# Patient Record
Sex: Female | Born: 1960 | Race: Black or African American | Hispanic: No | Marital: Single | Smoking: Former smoker
Health system: Southern US, Community
[De-identification: ages and names within clinical notes are randomized; demographics above are authoritative.]

## PROBLEM LIST (undated history)

## (undated) DIAGNOSIS — E785 Hyperlipidemia, unspecified: Secondary | ICD-10-CM

## (undated) DIAGNOSIS — I10 Essential (primary) hypertension: Secondary | ICD-10-CM

## (undated) HISTORY — PX: TONSILLECTOMY: SUR1361

## (undated) HISTORY — PX: CERVICAL SPINE SURGERY: SHX589

## (undated) HISTORY — PX: UTERINE FIBROID SURGERY: SHX826

---

## 2000-07-08 ENCOUNTER — Emergency Department (HOSPITAL_COMMUNITY): Admission: EM | Admit: 2000-07-08 | Discharge: 2000-07-08 | Payer: Self-pay | Admitting: Emergency Medicine

## 2008-05-06 ENCOUNTER — Encounter: Payer: Self-pay | Admitting: Physician Assistant

## 2008-05-06 ENCOUNTER — Ambulatory Visit: Payer: Self-pay | Admitting: Obstetrics & Gynecology

## 2008-05-06 ENCOUNTER — Other Ambulatory Visit: Admission: RE | Admit: 2008-05-06 | Discharge: 2008-05-06 | Payer: Self-pay | Admitting: Obstetrics & Gynecology

## 2008-05-06 LAB — CONVERTED CEMR LAB
Hemoglobin: 12.4 g/dL (ref 12.0–15.0)
MCHC: 34.4 g/dL (ref 30.0–36.0)
RDW: 25.6 % — ABNORMAL HIGH (ref 11.5–15.5)

## 2008-05-20 ENCOUNTER — Ambulatory Visit: Payer: Self-pay | Admitting: Obstetrics & Gynecology

## 2008-05-20 ENCOUNTER — Ambulatory Visit (HOSPITAL_COMMUNITY): Admission: RE | Admit: 2008-05-20 | Discharge: 2008-05-20 | Payer: Self-pay | Admitting: Family Medicine

## 2008-07-01 ENCOUNTER — Ambulatory Visit (HOSPITAL_COMMUNITY): Admission: RE | Admit: 2008-07-01 | Discharge: 2008-07-01 | Payer: Self-pay | Admitting: Obstetrics & Gynecology

## 2008-07-01 ENCOUNTER — Ambulatory Visit: Payer: Self-pay | Admitting: Obstetrics and Gynecology

## 2008-07-01 ENCOUNTER — Encounter: Payer: Self-pay | Admitting: Physician Assistant

## 2008-07-01 LAB — CONVERTED CEMR LAB: HCV Ab: NEGATIVE

## 2008-07-02 ENCOUNTER — Encounter: Payer: Self-pay | Admitting: Physician Assistant

## 2008-07-02 ENCOUNTER — Encounter: Payer: Self-pay | Admitting: Obstetrics & Gynecology

## 2008-07-02 LAB — CONVERTED CEMR LAB
Chlamydia, DNA Probe: NEGATIVE
GC Probe Amp, Genital: NEGATIVE
Trich, Wet Prep: NONE SEEN
Yeast Wet Prep HPF POC: NONE SEEN

## 2008-07-13 ENCOUNTER — Encounter: Admission: RE | Admit: 2008-07-13 | Discharge: 2008-07-13 | Payer: Self-pay | Admitting: Obstetrics & Gynecology

## 2008-08-05 ENCOUNTER — Ambulatory Visit: Payer: Self-pay | Admitting: Obstetrics & Gynecology

## 2008-11-04 ENCOUNTER — Ambulatory Visit: Payer: Self-pay | Admitting: Obstetrics and Gynecology

## 2009-01-20 ENCOUNTER — Ambulatory Visit: Payer: Self-pay | Admitting: Obstetrics & Gynecology

## 2009-04-07 ENCOUNTER — Ambulatory Visit: Payer: Self-pay | Admitting: Obstetrics and Gynecology

## 2009-05-13 ENCOUNTER — Ambulatory Visit: Payer: Self-pay | Admitting: Obstetrics and Gynecology

## 2009-05-13 ENCOUNTER — Ambulatory Visit (HOSPITAL_COMMUNITY): Admission: RE | Admit: 2009-05-13 | Discharge: 2009-05-13 | Payer: Self-pay | Admitting: Obstetrics and Gynecology

## 2010-02-27 ENCOUNTER — Encounter: Payer: Self-pay | Admitting: *Deleted

## 2010-04-27 LAB — CBC
MCHC: 32.9 g/dL (ref 30.0–36.0)
Platelets: 242 10*3/uL (ref 150–400)
RBC: 4.98 MIL/uL (ref 3.87–5.11)
RDW: 19 % — ABNORMAL HIGH (ref 11.5–15.5)

## 2010-06-21 NOTE — Group Therapy Note (Signed)
Suzanne Moss, HEGNER NO.:  [id number]   MEDICAL RECORD NO.:  [id number]          PATIENT TYPE:  WOC   LOCATION:  [facility]                   FACILITY:  [facility]   PHYSICIAN:  Elsie Lincoln, MD      DATE OF BIRTH:  1960-05-10   DATE OF SERVICE:  05/20/2008                                  CLINIC NOTE   The patient is a 50 year old female who presents for follow-up of her  testing for abnormal uterine bleeding that caused anemia and a blood  transfusion.  The patient had a transvaginal ultrasound which showed the  overall uterus measuring 13.4 x 6.6 x 13.2 cm.  There are at least four  discrete fibroids seen, the two largest are located in the right and  left uterine bodies measuring 9.4 cm and 5.9 cm in maximum diameters  retrospectively.  These two fibroids have partial submucosal component  displacing the endometrium.  A third fibroid is seen in the uterine  fundus which is a subserosal location and measures 5.6 cm and a fourth  fibroid is seen in the posterior uterine body measuring 5.1 cm.  The  right and left ovaries were normal.  The patient had a normal  endometrial biopsy.  The patient had a normal Pap smear, except had  Trichomonas vaginalis present.  The good news is that her hemoglobin is  12.4 which is increased from her discharge hemoglobin from a high point  of 11.1.  She started her period today and it is not heavy yet.  She  does need a mammogram.  She also needs complete STD panel testing.  I  discussed the patient's finding with her and she does not want surgery  at this time.  She would like to try the Depo-Provera shot.  I warned  her about the abnormal uterine bleeding for the first 6 months. I do not  know if she would be a good hydrothermal ablation candidate given that  there are two large submucosal fibroids invading the uterus.  I think  she would probably best be served with a hysterectomy if the Depo-  Provera does not help.   ASSESSMENT/PLAN:  A 50 year old female with abnormal uterine bleeding  and submucosal fibroids.  1. Depo-Provera.  2. Flagyl for Trichomonas.  3,  The patient refused STD testing but will get it on her follow-up  visit.  1. Return to clinic in 6 weeks.  2. Order mammogram if not already done.           ______________________________  Elsie Lincoln, MD     KL/MEDQ  D:  05/20/2008  T:  05/20/2008  Job:  [id number]

## 2010-06-21 NOTE — Group Therapy Note (Signed)
NAMEPAMI, WOOL NO.:  [id number]   MEDICAL RECORD NO.:  [id number]          PATIENT TYPE:  WOC   LOCATION:  [facility]                   FACILITY:  [facility]   PHYSICIAN:  Maylon Cos, CNM    DATE OF BIRTH:  November 16, 1960   DATE OF SERVICE:  05/06/2008                                  CLINIC NOTE   The patient presents today to GYN Clinic at [facility] as a follow-  up from [facility] ER.  The patient states that  she was evaluated the end of December.  Paperwork indicates December 27  of 2009. She went to the ER at [facility] for abdominal pain and she was  admitted for a urinary tract infection, abdominal pain, anemia and  physical finding of uterine fibroids according to the discharge  paperwork that the patient brought with her. Release of information has  been sent; however, official records are unavailable.  In the discharge  paperwork the patient's hemoglobin at the time of discharge was 11 and  all other labs were also within normal limits.  Ultrasound findings that  were noted on discharge instructions state that patient had a fibroid  uterus and a simple cyst on her left ovary measuring 1.6 cm otherwise  within normal limits.  The patient presents today really with no  complaints other than she has had a history of for the last 3 months of  having very heavy periods.  She states that the first 2-3 days of her  menstrual cycles which are regular do require her to change a super pad  every 30 minutes and then she progresses after the first 2-3 days to  normal period that lasts anywhere from 5 to 7 days.  She denies pain and  is not currently taking any medications other than iron sulfate.  She  has no known drug allergies.  Her immunizations are not up-to-date.   MENSTRUAL HISTORY:  She was 14 at menarche.  The  first day of her last  menstrual period was 04/20/2008.  Her cycles are regular.  There is  approximately 28 days  in between them.  They last 5-7 days and she has a  moderate amount of pain during her period.  Currently she is not using  anything for birth control.  She has never been pregnant.   GYNECOLOGIC HISTORY:  Her last Pap smear was in 1990.  She has no  history of any abnormal Pap smears.  Her last mammogram was in 2000 and  it was normal.   SURGICAL HISTORY:  None.   BLOOD TRANSFUSIONS:  January 2010 the patient received 2 units pack of  blood cells.   FAMILY HISTORY:  Noncontributory.   PERSONAL MEDICAL HISTORY:  Negative other than what is already been  mentioned in HPI.   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  She does not work  outside the home.  She smokes approximately 1/2 pack a day and has for  11 years.  She drank approximately one 6 pack of beer weekly.   REVIEW OF SYSTEMS:  Positive for fever or night  sweats, frequent  headaches and dizzy spells and hot flashes.   PHYSICAL EXAMINATION:  GENERAL:  On examination today Ms. Gerbino is a  frail looking African American female who appears to be older than her  stated age of 55.  VITAL SIGNS:  Her vital signs are within normal limits.  Blood pressure  is 106/75 and her weight is 107.3.   Her exam today is problem focused.  Her abdominal exam is benign.  However, her uterus is enlarged to approximately and has a fundal height  of 16 centimeters.  She has negative urine pregnancy test.  On bimanual  exam uterus is also confirmed to be enlarged to approximately 16-18  weeks size gestational size.  She has no cervical motion tenderness.  On  speculum exam, the cervix is nulliparous and smooth without lesions.  She has regular rugae.  Pap smear was obtained as well as endometrial  biopsy.  Endometrial biopsy was performed under no anesthesia.  Cervix  was cleansed using Betadine swab times one.  Tenaculum was placed at  this at 1 o'clock on the cervix and uterus sounded to 9.25 cm. One pass  was done with the  endometrial biopsy.  That retrieved a nice specimen  endometrial lining tissue.  It was placed in formalin.  Tenaculum was  removed and pressure was placed on the cervix to control a minimal  amount of bleeding.  Bleeding was ultimately stopped with silver nitrate  sticks.  The patient tolerated this procedure very well.   IMPRESSION:  1. Menorrhagia.  2. Uterine fibroids.   PLAN FOR TODAY:  1. Endometrial biopsy was performed today.  2. Pelvic ultrasound will be scheduled.  3. CBC is being drawn today.  4. Release of information from [facility] for      previous admissions records.  5. Mammogram scheduled per routine.  6. The patient will follow up approximately in 2 to 3 weeks after her      pelvic ultrasound to form plan.  The patient is not interested in      any inches any surgical procedures at this time.  In brief hormonal      options of oral contraceptives and Depo-Provera were reviewed.  The      patient she is not currently interested on starting on anything      now.  Her plan is to continue her iron supplementation and follow      up in a few weeks after the results of her biopsy and ultrasound      are available to discuss options.           ______________________________  Maylon Cos, CNM     SS/MEDQ  D:  05/06/2008  T:  05/06/2008  Job:  [id number]

## 2010-06-21 NOTE — Group Therapy Note (Signed)
Suzanne Moss, MORDAN NO.:  [id number]   MEDICAL RECORD NO.:  [id number]          PATIENT TYPE:  WOC   LOCATION:  [facility]                   FACILITY:  [facility]   PHYSICIAN:  Argentina Donovan, MD        DATE OF BIRTH:  [birth year]   DATE OF SERVICE:  07/01/2008                                  CLINIC NOTE   The patient is presenting today for follow up and STD screen.  She was  last seen in clinic on April 14 as sort of a hospital follow-up for  abnormal uterine bleeding that caused anemia and required blood  transfusion.  Her hemoglobin at that time had stabilized to 12.4 which  was up from her discharge hemoglobin of 11.1.  She reports that she is  still spotting mildly.  She was also started at that visit on Depo-  Provera.  She reports that she is still spotting today but overall is  continuing to do well at home.  She was also treated for Trichomonas  with Flagyl.  She reports that her partner has since then been treated  as well.  She had at that time declined STD panel screening, and so she  is following up for that today.   ASSESSMENT AND PLAN:  A 50 year old female with abnormal uterine  bleeding, some mucosal fibroids, and recently treated for Trichomonas.  We will continue her Depo-Provera shots.  She is due for the next one in  June.  GC, chlamydia and wet prep were obtained today.  We will also  obtain HIV, syphilis and hepatitis labs today.  She will follow up as  scheduled for her June 30 appointment to see if her spotting is  improving. Also of note, she did have normal endometrial biopsy and a  normal Pap smear on her previous office visit.  She has also received  her mammogram today.  We will follow up on the results of that.           ______________________________  Argentina Donovan, MD     PR/MEDQ  D:  07/01/2008  T:  07/01/2008  Job:  [id number]

## 2010-09-30 IMAGING — MG MM DIGITAL SCREENING BILAT
5 series · 5 of 5 positions shown · non-contrast
Comparison: none

DG SCREEN MAMMOGRAM BILATERAL
Bilateral CC and MLO view(s) were taken.
Technologist: Inger Banegas, RT RM

DIGITAL SCREENING MAMMOGRAM WITH CAD:
The breast tissue is heterogeneously dense.  Possible masses are noted in both breasts.  Spot 
compression views and possibly sonography are recommended for further evaluation.

[R CC]
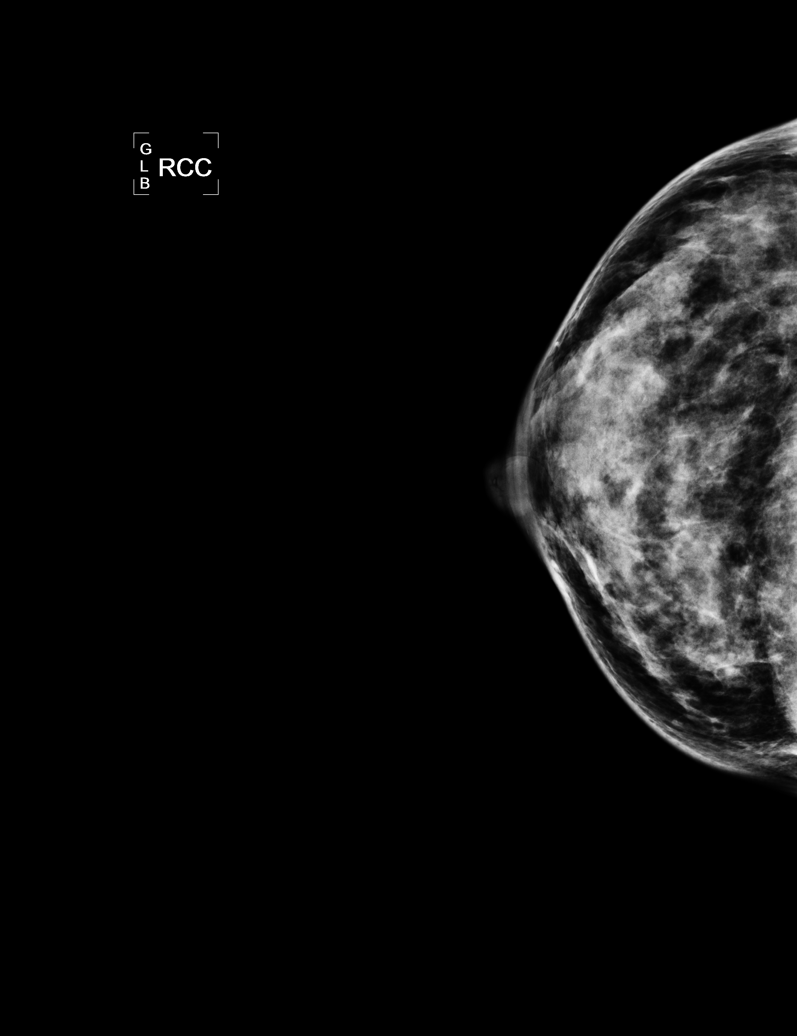

[R MLO]
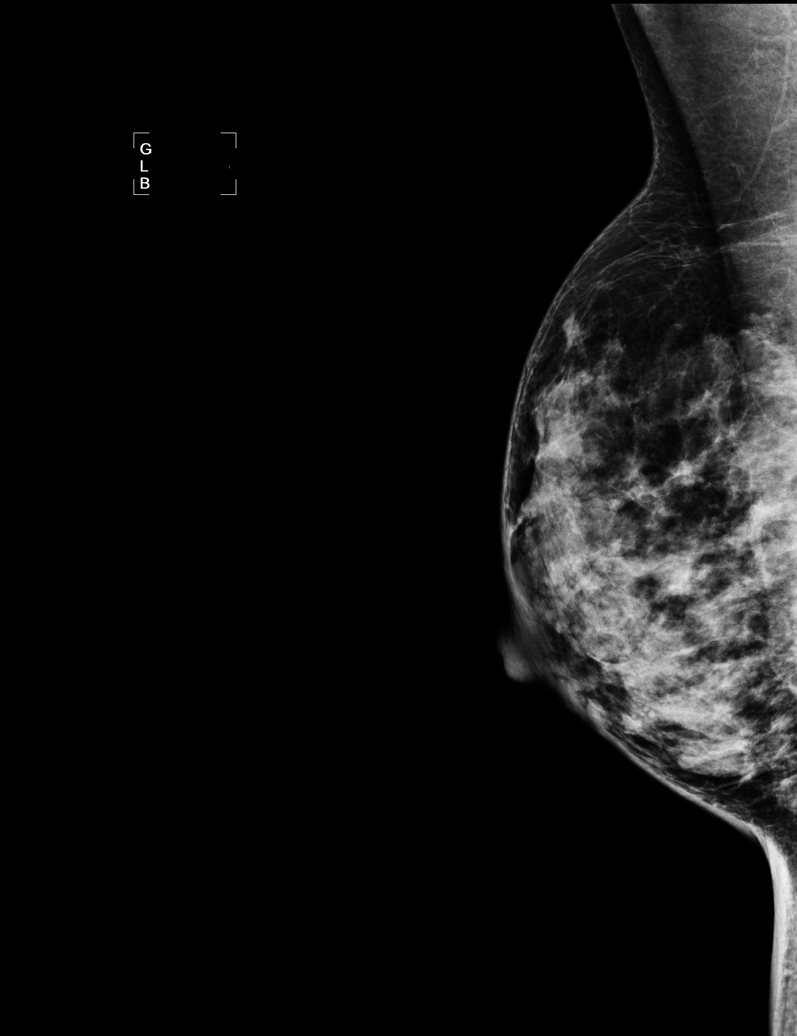

[L CC]
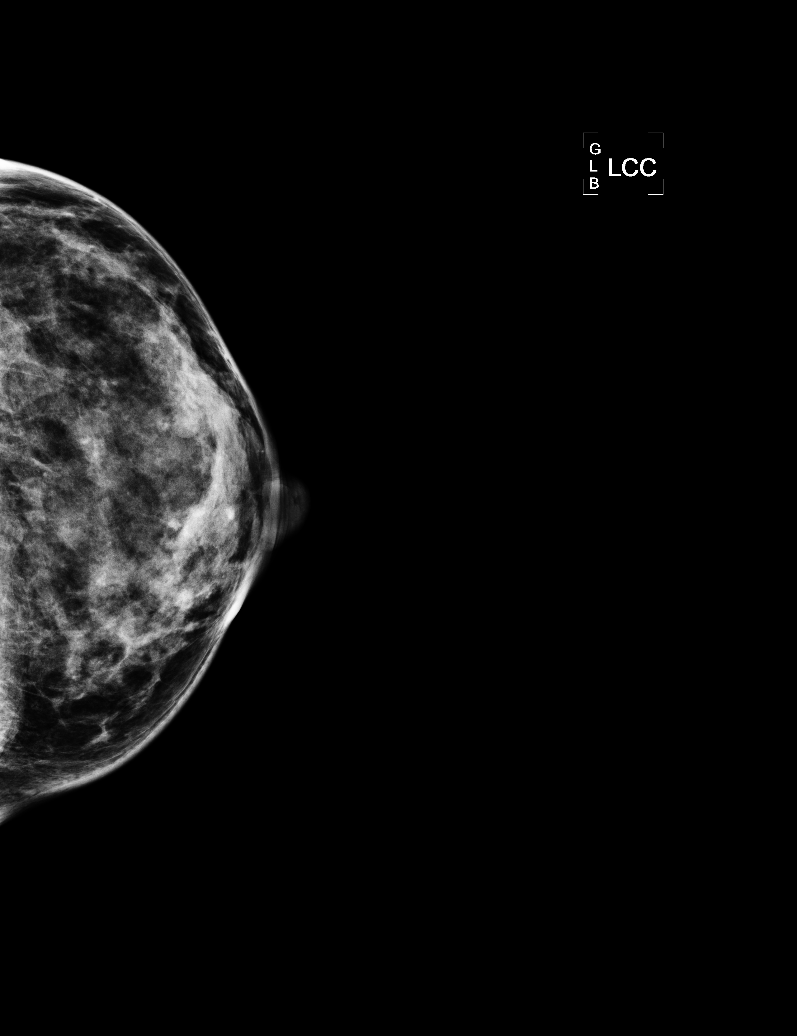

[L MLO (1 of 2)]
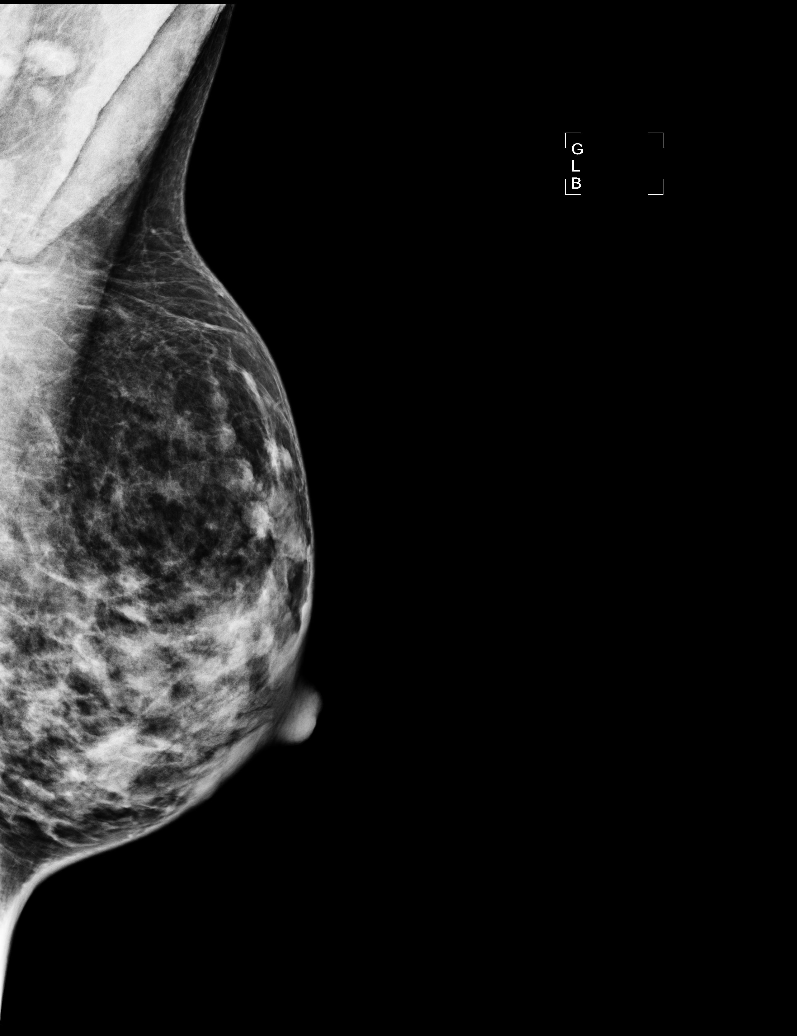

[L MLO (2 of 2)]
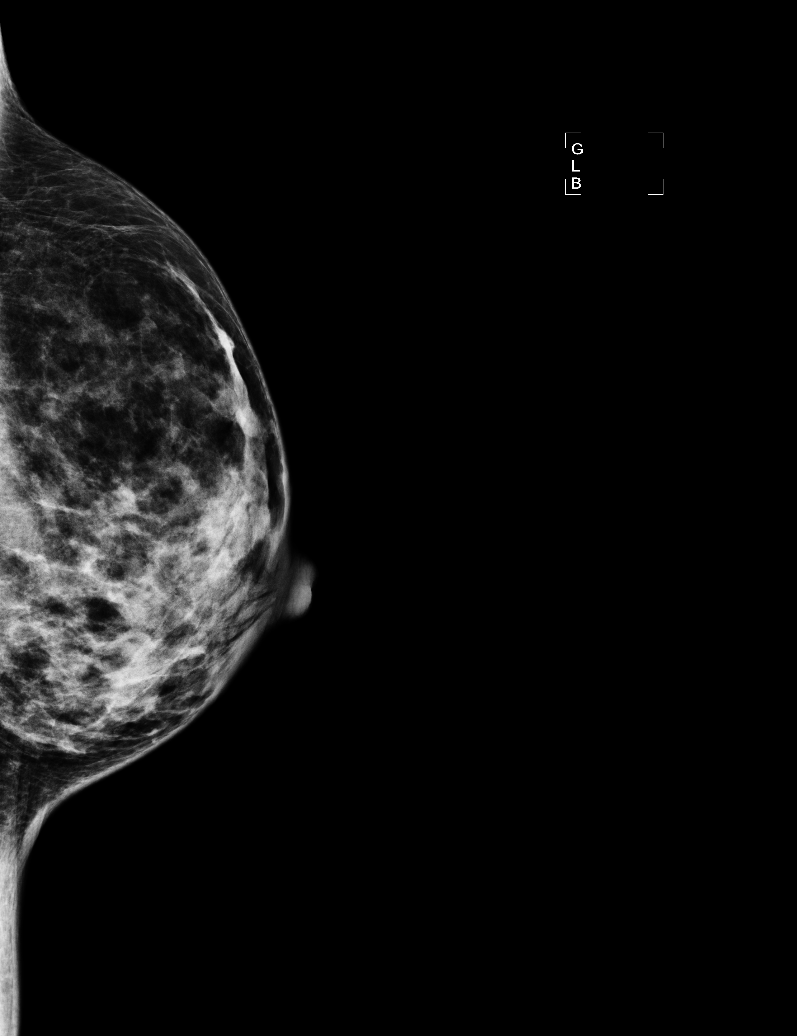

[5 of 5 positions shown; findings below may reference images not displayed]

IMPRESSION: Possible masses, bilateral breasts.  Additional evaluation is indicated.  The patient will be 
contacted for additional studies and a supplementary report will follow.

ASSESSMENT: Need additional imaging evaluation and/or prior mammograms for comparison - BI-RADS 0

Further imaging of both breasts.
ANALYZED BY COMPUTER AIDED DETECTION. , THIS PROCEDURE WAS A DIGITAL MAMMOGRAM.

## 2012-04-04 ENCOUNTER — Emergency Department (HOSPITAL_COMMUNITY)
Admission: EM | Admit: 2012-04-04 | Discharge: 2012-04-04 | Disposition: A | Discharge status: Home / Self Care | Payer: Self-pay | Attending: Emergency Medicine | Admitting: Emergency Medicine

## 2012-04-04 ENCOUNTER — Encounter (HOSPITAL_COMMUNITY): Payer: Self-pay | Admitting: Neurology

## 2012-04-04 DIAGNOSIS — F17200 Nicotine dependence, unspecified, uncomplicated: Secondary | ICD-10-CM | POA: Insufficient documentation

## 2012-04-04 DIAGNOSIS — L259 Unspecified contact dermatitis, unspecified cause: Secondary | ICD-10-CM | POA: Insufficient documentation

## 2012-04-04 DIAGNOSIS — L309 Dermatitis, unspecified: Secondary | ICD-10-CM

## 2012-04-04 DIAGNOSIS — N899 Noninflammatory disorder of vagina, unspecified: Secondary | ICD-10-CM | POA: Insufficient documentation

## 2012-04-04 MED ORDER — DIPHENHYDRAMINE HCL 25 MG PO TABS: 25.0000 mg | ORAL_TABLET | Freq: Four times a day (QID) | ORAL | Status: DC

## 2012-04-04 MED ORDER — DIPHENHYDRAMINE HCL 25 MG PO CAPS
50.0000 mg | ORAL_CAPSULE | Freq: Once | ORAL | Status: AC
  Administered 2012-04-04: 50 mg via ORAL
  Filled 2012-04-04: qty 2

## 2012-04-04 MED ORDER — PREDNISONE 10 MG PO TABS: 20.0000 mg | ORAL_TABLET | Freq: Every day | ORAL | Status: DC

## 2012-04-04 MED ORDER — PREDNISONE 20 MG PO TABS
60.0000 mg | ORAL_TABLET | Freq: Once | ORAL | Status: AC
  Administered 2012-04-04: 60 mg via ORAL
  Filled 2012-04-04: qty 3

## 2012-04-04 NOTE — ED Notes (Signed)
Pt stating vaginal itching x 1 week. Denies vaginal discharge. Denies urinary s/s.

## 2012-04-04 NOTE — ED Provider Notes (Signed)
History     CSN: [id number]  Arrival date & time 04/04/12  1653   First MD Initiated Contact with Patient 04/04/12 1751      Chief Complaint  Patient presents with  . Vaginitis    (Consider location/radiation/quality/duration/timing/severity/associated sxs/prior treatment) HPI Patient complaining of itching in the external vaginal area and groin for 2 days. She denies any intravaginal itching or discharge. She is postmenopausal for a year. She is sexually active with the same partner for 2 years denies any history of sexually transmitted diseases. She's not having any frequency of urination or dysuria. She has had no known new exposures to any soaps or change in detergent. She has been using vaginal cream without relief. History reviewed. No pertinent past medical history.  Past Surgical History  Procedure Laterality Date  . Uterine fibroid surgery      No family history on file.  History  Substance Use Topics  . Smoking status: Current Every Day Smoker  . Smokeless tobacco: Not on file  . Alcohol Use: Yes    OB History   Grav Para Term Preterm Abortions TAB SAB Ect Mult Living                  Review of Systems  All other systems reviewed and are negative.    Allergies  Review of patient's allergies indicates no known allergies.  Home Medications   Current Outpatient Rx  Name  Route  Sig  Dispense  Refill  . benzocaine-resorcinol (VAGISIL) 5-2 % vaginal cream   Vaginal   Place 1 application vaginally 2 (two) times daily as needed for itching or irritation.         Marland Kitchen ibuprofen (ADVIL,MOTRIN) 200 MG tablet   Oral   Take 400 mg by mouth every 6 (six) hours as needed for pain.           BP 130/89  Pulse 83  Temp(Src) 98.7 F (37.1 C) (Oral)  Resp 16  SpO2 100%  Physical Exam  Nursing note and vitals reviewed. Constitutional: She is oriented to person, place, and time. She appears well-developed and well-nourished.  HENT:  Head: Normocephalic  and atraumatic.  Right Ear: External ear normal.  Left Ear: External ear normal.  Eyes: Pupils are equal, round, and reactive to light.  Neck: Normal range of motion. Neck supple.  Cardiovascular: Normal rate, regular rhythm and normal heart sounds.   Pulmonary/Chest: Effort normal.  Abdominal: Soft. Bowel sounds are normal. There is no tenderness.  Genitourinary:  Patient with some excoriation of the skin on the lateral aspect of the labia majora and into the groin folds. Also some peri-rectal skin excoriation. Labia minor are normal. Speculum exam reveals normal discharge with no abnormality noted of the cervix.  Musculoskeletal: Normal range of motion.  Neurological: She is alert and oriented to person, place, and time.  Skin: Skin is warm and dry.  Psychiatric: She has a normal mood and affect.    ED Course  Procedures (including critical care time)  Labs Reviewed - No data to display No results found.   No diagnosis found.    MDM  Patient with vaginal dermatitis. She is advised to use warm soaks, she was placed on prednisone and given Benadryl.      Hilario Quarry, MD 04/04/12 [phone]

## 2019-08-07 LAB — COLOGUARD: COLOGUARD: NEGATIVE

## 2021-05-07 ENCOUNTER — Other Ambulatory Visit: Payer: Self-pay

## 2021-05-07 ENCOUNTER — Emergency Department (HOSPITAL_BASED_OUTPATIENT_CLINIC_OR_DEPARTMENT_OTHER)
Admission: EM | Admit: 2021-05-07 | Discharge: 2021-05-07 | Disposition: A | Discharge status: Home / Self Care | Payer: Medicaid Other | Attending: Emergency Medicine | Admitting: Emergency Medicine

## 2021-05-07 ENCOUNTER — Encounter (HOSPITAL_BASED_OUTPATIENT_CLINIC_OR_DEPARTMENT_OTHER): Payer: Self-pay | Admitting: Emergency Medicine

## 2021-05-07 DIAGNOSIS — X58XXXA Exposure to other specified factors, initial encounter: Secondary | ICD-10-CM | POA: Diagnosis not present

## 2021-05-07 DIAGNOSIS — S39012A Strain of muscle, fascia and tendon of lower back, initial encounter: Secondary | ICD-10-CM | POA: Diagnosis not present

## 2021-05-07 DIAGNOSIS — M545 Low back pain, unspecified: Secondary | ICD-10-CM | POA: Diagnosis present

## 2021-05-07 MED ORDER — NAPROXEN 375 MG PO TABS: 375.0000 mg | ORAL_TABLET | Freq: Two times a day (BID) | ORAL | 0 refills | Status: DC

## 2021-05-07 MED ORDER — KETOROLAC TROMETHAMINE 15 MG/ML IJ SOLN
15.0000 mg | Freq: Once | INTRAMUSCULAR | Status: AC
  Administered 2021-05-07: 15 mg via INTRAMUSCULAR
  Filled 2021-05-07: qty 1

## 2021-05-07 MED ORDER — METHOCARBAMOL 500 MG PO TABS: 500.0000 mg | ORAL_TABLET | Freq: Two times a day (BID) | ORAL | 0 refills | Status: DC

## 2021-05-07 MED ORDER — LIDOCAINE 5 % EX PTCH: 1.0000 | MEDICATED_PATCH | CUTANEOUS | 0 refills | Status: AC

## 2021-05-07 NOTE — Discharge Instructions (Addendum)
You likely have a lumbar muscle strain.  I have given you Toradol injection in the emergency room and sent Robaxin which is a muscle relaxer, naproxen which is an anti-inflammatory medication and lidocaine patch into the pharmacy for you.  He noted some improvement after the Toradol injection.  Please do not take ibuprofen along with naproxen as they are both anti-inflammatories.  Reserve Robaxin for evenings do not drive after you take it as this can make you drowsy.  If you have any new or worsening symptoms please return to the emergency room.  Otherwise follow-up with your PCP. ?

## 2021-05-07 NOTE — ED Notes (Signed)
PA at bedside.

## 2021-05-07 NOTE — ED Provider Notes (Signed)
?[facility] EMERGENCY DEPARTMENT ?Provider Note ? ? ?CSN: [id number] ?Arrival date & time: 05/07/21  1640 ? ?  ? ?History ? ?Chief Complaint  ?Patient presents with  ? Back Pain  ? ? ?Suzanne Moss is a 61 y.o. female. ? ?60 year old female presents today for evaluation of left lower back pain of 2-day duration.  Patient reports she woke up yesterday with the pain.  She denies fever, abdominal pain, nausea, vomiting, dysuria, history of kidney stones, history of IV drug use, unintentional weight loss, bowel or bladder loss, saddle anesthesia.  She denies any trauma to her back.  Denies any difficulty with ambulating. ? ?The history is provided by the patient. No language interpreter was used.  ? ?  ? ?Home Medications ?Prior to Admission medications   ?Medication Sig Start Date End Date Taking? Authorizing Provider  ?benzocaine-resorcinol (VAGISIL) 5-2 % vaginal cream Place 1 application vaginally 2 (two) times daily as needed for itching or irritation.    [provider]  ?diphenhydrAMINE (BENADRYL) 25 MG tablet Take 1 tablet (25 mg total) by mouth every 6 (six) hours. 04/04/12   Margarita Grizzle, MD  ?ibuprofen (ADVIL,MOTRIN) 200 MG tablet Take 400 mg by mouth every 6 (six) hours as needed for pain.    [provider]  ?predniSONE (DELTASONE) 10 MG tablet Take 2 tablets (20 mg total) by mouth daily. 04/04/12   Margarita Grizzle, MD  ?   ? ?Allergies    ?Gabapentin   ? ?Review of Systems   ?Review of Systems  ?Constitutional:  Negative for chills and fever.  ?Respiratory:  Negative for shortness of breath.   ?Gastrointestinal:  Negative for abdominal pain, nausea and vomiting.  ?Genitourinary:  Negative for difficulty urinating and dysuria.  ?Musculoskeletal:  Positive for back pain. Negative for gait problem.  ?Neurological:  Negative for weakness and light-headedness.  ?All other systems reviewed and are negative. ? ?Physical Exam ?Updated Vital Signs ?BP 120/83 (BP Location: Left Arm)    Pulse 84   Temp 98.4 ?F (36.9 ?C) (Oral)   Resp 16   Ht 5\' 2"  (1.575 m)   Wt 68 kg   SpO2 100%   BMI 27.44 kg/m?  ?Physical Exam ?Vitals and nursing note reviewed.  ?Constitutional:   ?   General: She is not in acute distress. ?   Appearance: Normal appearance. She is not ill-appearing.  ?HENT:  ?   Head: Normocephalic and atraumatic.  ?   Nose: Nose normal.  ?Eyes:  ?   General: No scleral icterus. ?   Extraocular Movements: Extraocular movements intact.  ?   Conjunctiva/sclera: Conjunctivae normal.  ?Cardiovascular:  ?   Rate and Rhythm: Normal rate and regular rhythm.  ?   Pulses: Normal pulses.  ?   Heart sounds: Normal heart sounds.  ?Pulmonary:  ?   Effort: Pulmonary effort is normal. No respiratory distress.  ?   Breath sounds: Normal breath sounds. No wheezing or rales.  ?Abdominal:  ?   General: There is no distension.  ?   Palpations: Abdomen is soft.  ?   Tenderness: There is no abdominal tenderness. There is no guarding.  ?Musculoskeletal:     ?   General: Normal range of motion.  ?   Cervical back: Normal range of motion.  ?   Comments: Cervical, thoracic, lumbar spine without tenderness to palpation, or step-offs.  Left lumbar paraspinal muscle with mild tenderness to palpation present.  Patient without hip, knee, ankle tenderness to palpation.  Patient with full range of motion in bilateral lower extremities with 5/5 strength.  Sensation intact.  2+ DP pulse present.  Brisk cap refill bilaterally.  Straight leg raise test negative.  Patient with full lumbar flexion, lumbar extension, lateral flexion of the lumbar without difficulty.  Thoracic rotation intact without difficulty.  Patient ambulates without difficulty.  ?Skin: ?   General: Skin is warm and dry.  ?Neurological:  ?   General: No focal deficit present.  ?   Mental Status: She is alert. Mental status is at baseline.  ? ? ?ED Results / Procedures / Treatments   ?Labs ?(all labs ordered are listed, but only abnormal results are  displayed) ?Labs Reviewed - No data to display ? ?EKG ?None ? ?Radiology ?No results found. ? ?Procedures ?Procedures  ? ? ?Medications Ordered in ED ?Medications  ?ketorolac (TORADOL) 15 MG/ML injection 15 mg (has no administration in time range)  ? ? ?ED Course/ Medical Decision Making/ A&P ?  ?                        ?Medical Decision Making ?Risk ?Prescription drug management. ? ? ?Medical Decision Making / ED Course ? ? ?This patient presents to the ED for concern of back pain, this involves an extensive number of treatment options, and is a complaint that carries with it a high risk of complications and morbidity.  The differential diagnosis includes muscle strain, lumbar spine fracture, spinal epidural abscess, cauda equina, nephrolithiasis, UTI, diverticulitis, pancreatitis ? ?MDM: ?61 year old female presents today for evaluation of 2-day duration of back pain.  She denies trauma.  She is without any difficulty with ambulating.  She is without any red flag signs or symptoms concerning for cauda equina, or spinal epidural abscess.  Patient is without dysuria concerning for UTI.  Abdomen nontender and nondistended and without associated symptoms concerning for acute intra-abdominal process.  Patient most likely has lumbar muscle strain.  Straight leg raise test negative for sciatica.  Patient appears comfortable on exam without acute distress.  Will provide Toradol IM in the emergency room.  Will discharge with naproxen, Robaxin, and lidocaine patch.  Discussed importance of follow-up with her PCP.  Patient voices understanding and is in agreement with plan.  Return precautions discussed for any concerning symptoms of a serious cause of back pain. ? ? ?Lab Tests: ?-I ordered, reviewed, and interpreted labs.   ?The pertinent results include:   ?Labs Reviewed - No data to display  ? ? ?EKG ? EKG Interpretation ? ?Date/Time:    ?Ventricular Rate:    ?PR Interval:    ?QRS Duration:   ?QT Interval:    ?QTC  Calculation:   ?R Axis:     ?Text Interpretation:   ?  ? ?  ? ? ? ?Medicines ordered and prescription drug management: ?Meds ordered this encounter  ?Medications  ? ketorolac (TORADOL) 15 MG/ML injection 15 mg  ?  ?-I have reviewed the patients home medicines and have made adjustments as needed ? ?Reevaluation: ?After the interventions noted above, I reevaluated the patient and found that they have :improved ? ?Co morbidities that complicate the patient evaluation ?History reviewed. No pertinent past medical history.  ? ? ?Dispostion: ?Patient is appropriate for discharge.  Discharged in stable condition.  Return precautions discussed.  Patient voiced understanding and is in agreement with plan. ? ? ? ?Final Clinical Impression(s) / ED Diagnoses ?Final diagnoses:  ?Strain of lumbar region, initial encounter  ? ? ?  Rx / DC Orders ?ED Discharge Orders   ? ?      Ordered  ?  naproxen (NAPROSYN) 375 MG tablet  2 times daily       ? 05/07/21 1744  ?  methocarbamol (ROBAXIN) 500 MG tablet  2 times daily       ? 05/07/21 1744  ?  lidocaine (LIDODERM) 5 %  Every 24 hours       ? 05/07/21 1744  ? ?  ?  ? ?  ? ? ?  ?Marita Kansas, PA-C ?05/07/21 1744 ? ?  ?Melene Plan, DO ?05/07/21 1903 ? ?

## 2021-05-07 NOTE — ED Triage Notes (Signed)
Pt arrives pov with c/o left lower back pain acutely yesterday. Pt denies fever or injury, denies urinary symptoms. No improvement with otc meds ?

## 2021-05-07 NOTE — ED Notes (Signed)
Pt ambulatory to restroom with steady gate.

## 2022-01-18 ENCOUNTER — Emergency Department (HOSPITAL_BASED_OUTPATIENT_CLINIC_OR_DEPARTMENT_OTHER)
Admission: EM | Admit: 2022-01-18 | Discharge: 2022-01-18 | Disposition: A | Discharge status: Home / Self Care | Payer: Medicaid Other | Attending: Emergency Medicine | Admitting: Emergency Medicine

## 2022-01-18 ENCOUNTER — Other Ambulatory Visit: Payer: Self-pay

## 2022-01-18 ENCOUNTER — Encounter (HOSPITAL_BASED_OUTPATIENT_CLINIC_OR_DEPARTMENT_OTHER): Payer: Self-pay

## 2022-01-18 DIAGNOSIS — R21 Rash and other nonspecific skin eruption: Secondary | ICD-10-CM | POA: Diagnosis present

## 2022-01-18 DIAGNOSIS — R799 Abnormal finding of blood chemistry, unspecified: Secondary | ICD-10-CM | POA: Insufficient documentation

## 2022-01-18 LAB — CBG MONITORING, ED: Glucose-Capillary: 120 mg/dL — ABNORMAL HIGH (ref 70–99)

## 2022-01-18 MED ORDER — CEPHALEXIN 500 MG PO CAPS: 500.0000 mg | ORAL_CAPSULE | Freq: Four times a day (QID) | ORAL | 0 refills | Status: DC

## 2022-01-18 NOTE — ED Provider Notes (Signed)
[facility] EMERGENCY DEPARTMENT Provider Note   CSN: [id number] Arrival date & time: 01/18/22  1531     History  Chief Complaint  Patient presents with   Wound Check    Suzanne Moss is a 61 y.o. female presenting to the ED today with chief complaint of new leg wounds on the outside of her legs.  States a large, dark purple wound began forming on the outside of her left lower leg about a month and a half ago, has been continuing to develop.  Has noticed a similar wound developing on the outside of the right lower leg starting a week and a half ago.  Denies discharge from wounds.  Noticed the wound had some itching, read online that apple cider vinegar helps this, applied some, and noticed some improvement.  No Hx of diabetes, known vascular disease, or autoimmune disease.  Denies headache, vision changes, abdominal pain, chest pain, shortness of breath, nausea, vomiting, leg pain, leg weakness, or numbness or tingling in the lower extremities.  Saw her PCP today, was informed that getting a appropriate referral would likely take too long, recommended pt to come to the ED.  No other complaints at this time.  The history is provided by the patient and medical records.  Wound Check     Home Medications Prior to Admission medications   Medication Sig Start Date End Date Taking? Authorizing Provider  benzocaine-resorcinol (VAGISIL) 5-2 % vaginal cream Place 1 application vaginally 2 (two) times daily as needed for itching or irritation.    [provider]  cephALEXin (KEFLEX) 500 MG capsule Take 1 capsule (500 mg total) by mouth 4 (four) times daily. 01/18/22   Cecil Cobbs, PA-C  diphenhydrAMINE (BENADRYL) 25 MG tablet Take 1 tablet (25 mg total) by mouth every 6 (six) hours. 04/04/12   Margarita Grizzle, MD  ibuprofen (ADVIL,MOTRIN) 200 MG tablet Take 400 mg by mouth every 6 (six) hours as needed for pain.    [provider]  lidocaine (LIDODERM) 5 %  Place 1 patch onto the skin daily. Remove & Discard patch within 12 hours or as directed by MD 05/07/21   Marita Kansas, PA-C  methocarbamol (ROBAXIN) 500 MG tablet Take 1 tablet (500 mg total) by mouth 2 (two) times daily. 05/07/21   Marita Kansas, PA-C  naproxen (NAPROSYN) 375 MG tablet Take 1 tablet (375 mg total) by mouth 2 (two) times daily. 05/07/21   Karie Mainland, Amjad, PA-C  predniSONE (DELTASONE) 10 MG tablet Take 2 tablets (20 mg total) by mouth daily. 04/04/12   Margarita Grizzle, MD     Allergies    Gabapentin    Review of Systems   Review of Systems  Skin:  Positive for wound.   Physical Exam Updated Vital Signs BP 121/82 (BP Location: Left Arm)   Pulse 92   Temp 98.5 F (36.9 C) (Oral)   Resp 18   Ht 5\' 2"  (1.575 m)   Wt 69.9 kg   SpO2 99%   BMI 28.17 kg/m  Physical Exam Vitals and nursing note reviewed.  Constitutional:      General: She is not in acute distress.    Appearance: She is well-developed.  HENT:     Head: Normocephalic and atraumatic.  Eyes:     General: No scleral icterus.    Conjunctiva/sclera: Conjunctivae normal.  Cardiovascular:     Rate and Rhythm: Normal rate and regular rhythm.     Heart sounds: No murmur heard.  Comments: DP and PT pulses 2+ bilaterally.  CRT less than 2. Pulmonary:     Effort: Pulmonary effort is normal. No respiratory distress.     Breath sounds: Normal breath sounds.     Comments: CTAB, able to communicate without difficulty, without increased respiratory effort Abdominal:     Palpations: Abdomen is soft.     Tenderness: There is no abdominal tenderness.  Musculoskeletal:        General: No swelling.     Cervical back: Neck supple. No rigidity.     Comments: Lower extremities with ROM, strength, and coordination grossly intact.  Without subjective or objective weakness appreciated.  Skin:    General: Skin is warm and dry.     Capillary Refill: Capillary refill takes less than 2 seconds.     Comments: See photos.  Areas are  nontender, without drainage, without significant surrounding erythema, swelling or warmth.  No calf pain, swelling, erythema, or unilateral swelling.  Neurological:     Mental Status: She is alert and oriented to person, place, and time.  Psychiatric:        Mood and Affect: Mood normal.         ED Results / Procedures / Treatments   Labs (all labs ordered are listed, but only abnormal results are displayed) Labs Reviewed  CBG MONITORING, ED - Abnormal; Notable for the following components:      Result Value   Glucose-Capillary 120 (*)    All other components within normal limits    EKG None  Radiology No results found.  Procedures Procedures    Medications Ordered in ED Medications - No data to display  ED Course/ Medical Decision Making/ A&P                           Medical Decision Making Risk Prescription drug management.   61 y.o. female presents to the ED for concern of Wound Check   This involves an extensive number of treatment options, and is a complaint that carries with it a high risk of complications and morbidity.  The emergent differential diagnosis prior to evaluation includes, but is not limited to: venous ulcer, arterial ulcer, cellulitis  This is not an exhaustive differential.   Past Medical History / Co-morbidities / Social History: No significant history Social Determinants of Health include: Elderly  Additional History:  None  Lab Tests: I ordered, and personally interpreted labs.  The pertinent results include:   POC CBG 120  Imaging Studies: None  ED Course: Pt well-appearing on exam.  Nontoxic nonseptic appearing in NAD.  Hemodynamically stable.  Presenting with gradually developing rash/discoloration of lateral aspects of lower extremities.  See photos.  Left lower extremity has developed over 1.5 months, the other over 1.5 weeks.  Clinical appearance not strongly suggestive of venous ulcer, arterial ulcer, or diabetic ulcer.   Again without hx of diabetes mellitus, known vascular disease, or other predisposed risk factors.  Has developed over time, and at equal locations of lateral malleoli on both sides.  Nonpainful, without weeping, without punched-out appearance, without significant surrounding erythema or warmth.  No lower extremity swelling.  No prior Hx of DVT, without clinical suggestion of DVT.  Without shortness of breath or chest pain.  Lower extremities appear neurovascularly intact without evidence of fluid overload or poor blood supply. Overall, I am uncertain the exact etiology of the patient's symptoms.  However, I do not believe she is currently experiencing  a medical, surgical, or psychiatric emergency.  May be a very early and so developing cellulitis, patient may benefit from a short course of antibiotics.  Shared decision making, patient elects to proceed with short course of antibiotics and PCP follow-up.  Also recommend following up with dermatology for further evaluation, resources provided.  Patient reports satisfaction with today's encounter.  Patient in NAD and in good condition at time of discharge.  Disposition: After consideration the patient's encounter today, I do not feel today's workup suggests an emergent condition requiring admission or immediate intervention beyond what has been performed at this time.  Safe for discharge; instructed to return immediately for worsening symptoms, change in symptoms or any other concerns.  I have reviewed the patients home medicines and have made adjustments as needed.  Discussed course of treatment with the patient, whom demonstrated understanding.  Patient in agreement and has no further questions.    I discussed this case with my attending physician Dr. Rush Landmark, who agreed with the proposed treatment course and cosigned this note.  Attending physician stated agreement with plan or made changes to plan which were implemented.     This chart was dictated using  voice recognition software.  Despite best efforts to proofread, errors can occur which can change the documentation meaning.         Final Clinical Impression(s) / ED Diagnoses Final diagnoses:  Rash of unknown cause    Rx / DC Orders ED Discharge Orders          Ordered    cephALEXin (KEFLEX) 500 MG capsule  4 times daily,   Status:  Discontinued        01/18/22 1701    cephALEXin (KEFLEX) 500 MG capsule  4 times daily        01/18/22 1702              Cecil Cobbs, [place] 01/22/22 1740    Tegeler, Canary Brim, MD 01/23/22 [phone]

## 2022-01-18 NOTE — ED Triage Notes (Signed)
Pt presents with an ulceration on the lateral aspect of her L lower leg. Area is erythremic and ecchymotic with small breaks in the skin. Lesion has been present and gradually worsening x 1 month. Pt has another smaller ulceration on the R lateral leg with similar appearance that appeared 1 week ago.

## 2022-01-18 NOTE — ED Notes (Signed)
Discharge instructions reviewed with patient. Patient verbalizes understanding, no further questions at this time. Medications/prescriptions and follow up information provided. No acute distress noted at time of departure.  

## 2022-01-18 NOTE — Discharge Instructions (Addendum)
Your evaluated in the emergency department today due to leg wounds of the lower legs.  Overall these do not appear to be venous ulcers or arterial ulcers at this time.  Please follow-up with dermatology for further evaluation and continued medical management.  Contact information for to local dermatology offices have been provided for you.  Please call to schedule an appointment.  Please continue to follow-up with your PCP within the next 5 days for reevaluation as well.  An antibiotic has been called in your pharmacy by the name of Keflex.  Take as instructed.  Always take with plenty of food and water.  Continue to monitor the areas until you are able to follow-up with your PCP/dermatology.  Return to the ED for new or worsening symptoms as discussed.

## 2022-09-08 ENCOUNTER — Emergency Department (HOSPITAL_BASED_OUTPATIENT_CLINIC_OR_DEPARTMENT_OTHER)
Admission: EM | Admit: 2022-09-08 | Discharge: 2022-09-08 | Disposition: A | Discharge status: Home / Self Care | Payer: Medicaid Other | Attending: Emergency Medicine | Admitting: Emergency Medicine

## 2022-09-08 ENCOUNTER — Encounter (HOSPITAL_BASED_OUTPATIENT_CLINIC_OR_DEPARTMENT_OTHER): Payer: Self-pay

## 2022-09-08 ENCOUNTER — Other Ambulatory Visit: Payer: Self-pay

## 2022-09-08 ENCOUNTER — Emergency Department (HOSPITAL_BASED_OUTPATIENT_CLINIC_OR_DEPARTMENT_OTHER): Payer: Medicaid Other

## 2022-09-08 DIAGNOSIS — I10 Essential (primary) hypertension: Secondary | ICD-10-CM | POA: Insufficient documentation

## 2022-09-08 DIAGNOSIS — Z87891 Personal history of nicotine dependence: Secondary | ICD-10-CM | POA: Insufficient documentation

## 2022-09-08 DIAGNOSIS — R0789 Other chest pain: Secondary | ICD-10-CM | POA: Insufficient documentation

## 2022-09-08 LAB — BASIC METABOLIC PANEL
Anion gap: 7 (ref 5–15)
BUN: 17 mg/dL (ref 8–23)
CO2: 21 mmol/L — ABNORMAL LOW (ref 22–32)
Calcium: 9 mg/dL (ref 8.9–10.3)
Chloride: 106 mmol/L (ref 98–111)
Creatinine, Ser: 0.7 mg/dL (ref 0.44–1.00)
GFR, Estimated: >60 mL/min (ref >60)
Glucose, Bld: 98 mg/dL (ref 70–99)
Potassium: 3.6 mmol/L (ref 3.5–5.1)
Sodium: 134 mmol/L — ABNORMAL LOW (ref 135–145)

## 2022-09-08 LAB — CBC
HCT: 36.6 % (ref 36.0–46.0)
Hemoglobin: 12.4 g/dL (ref 12.0–15.0)
MCH: 25.1 pg — ABNORMAL LOW (ref 26.0–34.0)
MCHC: 33.9 g/dL (ref 30.0–36.0)
MCV: 74.1 fL — ABNORMAL LOW (ref 80.0–100.0)
Platelets: 245 10*3/uL (ref 150–400)
RBC: 4.94 MIL/uL (ref 3.87–5.11)
RDW: 15.1 % (ref 11.5–15.5)
WBC: 6.3 10*3/uL (ref 4.0–10.5)
nRBC: 0 % (ref 0.0–0.2)

## 2022-09-08 LAB — TROPONIN I (HIGH SENSITIVITY): Troponin I (High Sensitivity): <2 ng/L (ref <18)

## 2022-09-08 MED ORDER — FAMOTIDINE 20 MG PO TABS: 20.0000 mg | ORAL_TABLET | Freq: Every day | ORAL | 2 refills | Status: AC

## 2022-09-08 MED ORDER — FAMOTIDINE 20 MG PO TABS
20.0000 mg | ORAL_TABLET | Freq: Once | ORAL | Status: AC
  Administered 2022-09-08: 20 mg via ORAL
  Filled 2022-09-08: qty 1

## 2022-09-08 MED ORDER — ALUM & MAG HYDROXIDE-SIMETH 200-200-20 MG/5ML PO SUSP
15.0000 mL | Freq: Once | ORAL | Status: AC
  Administered 2022-09-08: 15 mL via ORAL
  Filled 2022-09-08: qty 30

## 2022-09-08 NOTE — Discharge Instructions (Signed)
As discussed, I suspect your symptoms are likely secondary to GERD.  See information attached regarding send condition.  Your heart enzymes are normal.  Chest x-ray is without signs of pneumonia or other abnormality.  EKG appears normal.  Will prescribe this medicine we gave you on the emergency department called pepcid to take at home. You may find maalox or the liquid you took over the counter at your local pharmacy.  Please do not hesitate to return to emergency department for worrisome signs and symptoms we discussed become apparent.

## 2022-09-08 NOTE — ED Provider Notes (Signed)
[place] EMERGENCY DEPARTMENT AT [facility] Provider Note   CSN: [id number] Arrival date & time: 09/08/22  1231     History  Chief Complaint  Patient presents with   Chest Pain    Suzanne Moss is a 62 y.o. female.   Chest Pain   62 year old female presents emergency department with complaints of left-sided chest tightness.  Patient states that she began with left-sided chest tightness yesterday evening right after she ate dinner.  States that she is eating dinner and that and had just finished when left-sided chest tightness began.  States that pain still lingered into this morning which prompted her visit to the emergency department.  States the pain was not worsened with exertion.  Denies any cough, fever, shortness of breath.,  Abdominal pain, nausea, vomiting, urinary symptoms, change in bowel habits.  Denies any radiation of pain.  Past medical history significant for hyper tension, hyperlipidemia  Home Medications Prior to Admission medications   Medication Sig Start Date End Date Taking? Authorizing Provider  famotidine (PEPCID) 20 MG tablet Take 1 tablet (20 mg total) by mouth daily. 09/08/22  Yes Sherian Maroon A, PA  benzocaine-resorcinol (VAGISIL) 5-2 % vaginal cream Place 1 application vaginally 2 (two) times daily as needed for itching or irritation.    [provider]  cephALEXin (KEFLEX) 500 MG capsule Take 1 capsule (500 mg total) by mouth 4 (four) times daily. 01/18/22   Cecil Cobbs, PA-C  diphenhydrAMINE (BENADRYL) 25 MG tablet Take 1 tablet (25 mg total) by mouth every 6 (six) hours. 04/04/12   Margarita Grizzle, MD  ibuprofen (ADVIL,MOTRIN) 200 MG tablet Take 400 mg by mouth every 6 (six) hours as needed for pain.    [provider]  lidocaine (LIDODERM) 5 % Place 1 patch onto the skin daily. Remove & Discard patch within 12 hours or as directed by MD 05/07/21   Marita Kansas, PA-C  methocarbamol (ROBAXIN) 500 MG tablet Take 1  tablet (500 mg total) by mouth 2 (two) times daily. 05/07/21   Marita Kansas, PA-C  naproxen (NAPROSYN) 375 MG tablet Take 1 tablet (375 mg total) by mouth 2 (two) times daily. 05/07/21   Karie Mainland, Amjad, PA-C  predniSONE (DELTASONE) 10 MG tablet Take 2 tablets (20 mg total) by mouth daily. 04/04/12   Margarita Grizzle, MD      Allergies    Gabapentin    Review of Systems   Review of Systems  Cardiovascular:  Positive for chest pain.  All other systems reviewed and are negative.   Physical Exam Updated Vital Signs BP 107/75   Pulse 61   Temp 98.1 F (36.7 C) (Oral)   Resp 20   Ht 5\' 2"  (1.575 m)   Wt 68 kg   SpO2 100%   BMI 27.44 kg/m  Physical Exam Vitals and nursing note reviewed.  Constitutional:      General: She is not in acute distress.    Appearance: She is well-developed.  HENT:     Head: Normocephalic and atraumatic.  Eyes:     Conjunctiva/sclera: Conjunctivae normal.  Cardiovascular:     Rate and Rhythm: Normal rate and regular rhythm.     Pulses: Normal pulses.  Pulmonary:     Effort: Pulmonary effort is normal. No respiratory distress.     Breath sounds: Normal breath sounds. No wheezing, rhonchi or rales.  Chest:     Chest wall: No tenderness.  Abdominal:     Palpations: Abdomen is soft.  Tenderness: There is no abdominal tenderness.  Musculoskeletal:        General: No swelling.     Cervical back: Neck supple.     Right lower leg: No edema.     Left lower leg: No edema.  Skin:    General: Skin is warm and dry.     Capillary Refill: Capillary refill takes less than 2 seconds.  Neurological:     Mental Status: She is alert.  Psychiatric:        Mood and Affect: Mood normal.     ED Results / Procedures / Treatments   Labs (all labs ordered are listed, but only abnormal results are displayed) Labs Reviewed  BASIC METABOLIC PANEL - Abnormal; Notable for the following components:      Result Value   Sodium 134 (*)    CO2 21 (*)    All other components  within normal limits  CBC - Abnormal; Notable for the following components:   MCV 74.1 (*)    MCH 25.1 (*)    All other components within normal limits  TROPONIN I (HIGH SENSITIVITY)    EKG None  Radiology DG Chest 2 View  Result Date: 09/08/2022 CLINICAL DATA:  chest tightness EXAM: CHEST - 2 VIEW COMPARISON:  None Available. FINDINGS: Bilateral lung fields are clear. Bilateral costophrenic angles are clear. Normal cardio-mediastinal silhouette. Age indeterminate mild anterior wedging deformity of lower thoracic vertebrae likely T11. Correlate clinically to determine the need for additional imaging. Otherwise, no acute osseous abnormalities. Lower cervical spinal fixation hardware is seen. The soft tissues are within normal limits. IMPRESSION: 1. No active cardiopulmonary disease. 2. Age indeterminate mild anterior wedging deformity of lower thoracic vertebrae likely T11. Electronically Signed   By: Jules Schick M.D.   On: 09/08/2022 13:35    Procedures Procedures    Medications Ordered in ED Medications  famotidine (PEPCID) tablet 20 mg (20 mg Oral Given 09/08/22 1342)  alum & mag hydroxide-simeth (MAALOX/MYLANTA) 200-200-20 MG/5ML suspension 15 mL (15 mLs Oral Given 09/08/22 1343)    ED Course/ Medical Decision Making/ A&P                                 Medical Decision Making Amount and/or Complexity of Data Reviewed Labs: ordered. Radiology: ordered.  Risk OTC drugs.   This patient presents to the ED for concern of chest pain, this involves an extensive number of treatment options, and is a complaint that carries with it a high risk of complications and morbidity.  The differential diagnosis includes ACS, PE, musculoskeletal, GERD, aortic dissection, aortic aneurysm, pericarditis/myocarditis/tamponade   Co morbidities that complicate the patient evaluation  See HPI   Additional history obtained:  Additional history obtained from EMR External records from outside  source obtained and reviewed including hospital records   Lab Tests:  I Ordered, and personally interpreted labs.  The pertinent results include: No leukocytosis.  No evidence of anemia.  Platelets within range.  Mild hyponatremia 134 as well as decrease in bicarb of 21 but otherwise, electrolytes within normal limits.  No renal dysfunction.  Troponin of less than 2    Imaging Studies ordered:  I ordered imaging studies including chest x-ray I independently visualized and interpreted imaging which showed no acute pulmonary abnormality.  Wedge deformity of T11 chronic I agree with the radiologist interpretation   Cardiac Monitoring: / EKG:  The patient was maintained on a cardiac  monitor.  I personally viewed and interpreted the cardiac monitored which showed an underlying rhythm of: Manage rhythm   Consultations Obtained:  N/a   Problem List / ED Course / Critical interventions / Medication management  Other chest pain I ordered medication including Maalox, Pepcid   Reevaluation of the patient after these medicines showed that the patient improved I have reviewed the patients home medicines and have made adjustments as needed   Social Determinants of Health:  Former cigarette use.  Denies illicit drug use.   Test / Admission - Considered:  Other chest pain Vitals signs within normal range and stable throughout visit. Laboratory/imaging studies significant for: See above 62 year old female presents emergency department complaints of left-sided chest tightness after eating dinner last night.  Symptoms have been constant since consumption of dinner.  Chest pain without radiation, nonexertional, nonpleuritic in nature.  Patient's workup today overall reassuring.  Patient with negative troponin, lack of acute ischemic changes on EKG; doubt ACS.  Given duration since symptom onset, second troponin deemed unnecessary.  Patient with heart score of 0-3.  Patient's pain nonpleuritic  in nature without associated shortness of breath, tachypnea, tachycardia, risk factors for DVT/PE.  Patient with low Pesi score so doubt PE.  Patient without pain radiating to the back, pulse deficits, neurologic deficits, significant hypertension so low suspicion for aortic dissection.  Doubt pericarditis/myocarditis/tamponade.  No evidence of pneumonia.  Patient with evidence of volume overload, pulmonary vascular congestion; doubt CHF.  Patient had resolution of symptoms with administration of GI cocktail.  I suspect patient's symptoms are likely secondary to GERD.  Will recommend treatment of GERD in the outpatient setting as well as appropriate dietary/lifestyle modifications to decrease likelihood of exacerbation.  Will recommend follow-up with primary care in the outpatient setting for reevaluation of symptoms.  Treatment plan discussed at length with patient and she acknowledged understanding was agreeable to said plan.  Patient overall well-appearing, afebrile in no acute distress. Worrisome signs and symptoms were discussed with the patient, and the patient acknowledged understanding to return to the ED if noticed. Patient was stable upon discharge.          Final Clinical Impression(s) / ED Diagnoses Final diagnoses:  Other chest pain    Rx / DC Orders ED Discharge Orders          Ordered    famotidine (PEPCID) 20 MG tablet  Daily        09/08/22 1455              Peter Garter, [place] 09/08/22 1545    Rozelle Logan, DO 09/17/22 1900

## 2022-09-08 NOTE — ED Triage Notes (Signed)
Patient having chest tightness since last night. She denied Pain, shortness of breath, cough.

## 2022-09-08 NOTE — ED Notes (Signed)
Pt. Is alert and oriented and reports she had chest tightness last night.  Pt. Is in no distress with no nausea.  Pt. Said she is feeling better today and just felt she should be checked out.

## 2022-11-13 ENCOUNTER — Other Ambulatory Visit: Payer: Self-pay

## 2022-11-13 ENCOUNTER — Emergency Department (HOSPITAL_BASED_OUTPATIENT_CLINIC_OR_DEPARTMENT_OTHER)
Admission: EM | Admit: 2022-11-13 | Discharge: 2022-11-13 | Disposition: A | Discharge status: Home / Self Care | Payer: Medicaid Other | Attending: Emergency Medicine | Admitting: Emergency Medicine

## 2022-11-13 ENCOUNTER — Encounter (HOSPITAL_BASED_OUTPATIENT_CLINIC_OR_DEPARTMENT_OTHER): Payer: Self-pay

## 2022-11-13 ENCOUNTER — Emergency Department (HOSPITAL_BASED_OUTPATIENT_CLINIC_OR_DEPARTMENT_OTHER): Payer: Medicaid Other

## 2022-11-13 DIAGNOSIS — M25511 Pain in right shoulder: Secondary | ICD-10-CM | POA: Diagnosis present

## 2022-11-13 DIAGNOSIS — M62838 Other muscle spasm: Secondary | ICD-10-CM | POA: Insufficient documentation

## 2022-11-13 HISTORY — DX: Hyperlipidemia, unspecified: E78.5

## 2022-11-13 HISTORY — DX: Essential (primary) hypertension: I10

## 2022-11-13 MED ORDER — CYCLOBENZAPRINE HCL 10 MG PO TABS: 10.0000 mg | ORAL_TABLET | Freq: Two times a day (BID) | ORAL | 0 refills | Status: DC | PRN

## 2022-11-13 MED ORDER — LIDOCAINE 5 % EX PTCH
1.0000 | MEDICATED_PATCH | CUTANEOUS | Status: DC
  Administered 2022-11-13: 1 via TRANSDERMAL
  Filled 2022-11-13: qty 1

## 2022-11-13 MED ORDER — LIDOCAINE 5 % EX PTCH: 1.0000 | MEDICATED_PATCH | CUTANEOUS | 0 refills | Status: AC

## 2022-11-13 NOTE — Discharge Instructions (Addendum)
Please follow-up with your primary care provider in regards to recent ER visit.  Today your exam shows you have a muscle spasm causing your symptoms.  You may take Tylenol every 6 hours needed for pain.  You may use heat packs along with the Flexeril I prescribed for you.  Please do not operate machinery or drive after taking this medication as it is very sedating.  You may also get massages to help with the affected area.  If symptoms change or worsen please return to ER.

## 2022-11-13 NOTE — ED Notes (Signed)
Patient transported to X-ray 

## 2022-11-13 NOTE — ED Triage Notes (Signed)
In for eval of right lower neck and right shoulder pain onset 3 days ago. Denies fall or injury.

## 2022-11-13 NOTE — ED Provider Notes (Signed)
[place] [facility] Provider Note   CSN: [id number] Arrival date & time: 11/13/22  0944     History  Chief Complaint  Patient presents with   Shoulder Pain    Suzanne Moss is a 62 y.o. female history of chronic right shoulder pain currently being seen by orthopedics and [place] presented with right shoulder pain that began last night.  Patient states it feels like there is a tightness in the muscle above her shoulder between her shoulder and neck denies neck pain.  Patient denies new onset weakness, change in sensation of smell skills, trauma, deformities.  Patient has tried ibuprofen along with heat and ice packs which do seem to help and wants to have it evaluated.  Home Medications Prior to Admission medications   Medication Sig Start Date End Date Taking? Authorizing Provider  amLODipine (NORVASC) 5 MG tablet Take 5 mg by mouth daily.   Yes [provider]  atorvastatin (LIPITOR) 10 MG tablet Take 10 mg by mouth daily.   Yes [provider]  cyclobenzaprine (FLEXERIL) 10 MG tablet Take 1 tablet (10 mg total) by mouth 2 (two) times daily as needed for muscle spasms. 11/13/22  Yes Palin Tristan, Fayrene Fearing T, PA-C  lidocaine (LIDODERM) 5 % Place 1 patch onto the skin daily. Remove & Discard patch within 12 hours or as directed by MD 11/13/22  Yes Lorelle Macaluso, Beverly Gust, PA-C  benzocaine-resorcinol (VAGISIL) 5-2 % vaginal cream Place 1 application vaginally 2 (two) times daily as needed for itching or irritation.    [provider]  cephALEXin (KEFLEX) 500 MG capsule Take 1 capsule (500 mg total) by mouth 4 (four) times daily. 01/18/22   Cecil Cobbs, PA-C  diphenhydrAMINE (BENADRYL) 25 MG tablet Take 1 tablet (25 mg total) by mouth every 6 (six) hours. 04/04/12   Margarita Grizzle, MD  famotidine (PEPCID) 20 MG tablet Take 1 tablet (20 mg total) by mouth daily. 09/08/22   Peter Garter, PA  ibuprofen (ADVIL,MOTRIN) 200 MG  tablet Take 400 mg by mouth every 6 (six) hours as needed for pain.    [provider]  lidocaine (LIDODERM) 5 % Place 1 patch onto the skin daily. Remove & Discard patch within 12 hours or as directed by MD 05/07/21   Marita Kansas, PA-C  methocarbamol (ROBAXIN) 500 MG tablet Take 1 tablet (500 mg total) by mouth 2 (two) times daily. 05/07/21   Marita Kansas, PA-C  naproxen (NAPROSYN) 375 MG tablet Take 1 tablet (375 mg total) by mouth 2 (two) times daily. 05/07/21   Karie Mainland, Amjad, PA-C  predniSONE (DELTASONE) 10 MG tablet Take 2 tablets (20 mg total) by mouth daily. 04/04/12   Margarita Grizzle, MD      Allergies    Gabapentin    Review of Systems   Review of Systems  Physical Exam Updated Vital Signs BP 116/78   Pulse 68   Temp 97.8 F (36.6 C) (Oral)   Resp 17   Ht 5\' 2"  (1.575 m)   Wt 68 kg   SpO2 100%   BMI 27.44 kg/m  Physical Exam Vitals reviewed.  Constitutional:      General: She is not in acute distress. Cardiovascular:     Rate and Rhythm: Normal rate.     Pulses: Normal pulses.  Musculoskeletal:     Cervical back: Normal range of motion. No tenderness.     Comments: Right shoulder: 5/ 5 shoulder adduction/abduction, external rotation/internal rotation, unremarkable hand  to belly exam, unremarkable empty can test, tender to palpation right trapezius/supraspinatus muscle without abnormalities palpated, no clavicular pain or abnormalities palpated, nontender on deltoid muscle Pain not out of proportion Soft compartments  Skin:    General: Skin is warm and dry.     Capillary Refill: Capillary refill takes less than 2 seconds.  Neurological:     Mental Status: She is alert.     Comments: Sensation intact distally  Psychiatric:        Mood and Affect: Mood normal.     ED Results / Procedures / Treatments   Labs (all labs ordered are listed, but only abnormal results are displayed) Labs Reviewed - No data to display  EKG None  Radiology No results  found.  Procedures Procedures    Medications Ordered in ED Medications  lidocaine (LIDODERM) 5 % 1 patch (1 patch Transdermal Patch Applied 11/13/22 1119)    ED Course/ Medical Decision Making/ A&P                                 Medical Decision Making Amount and/or Complexity of Data Reviewed Radiology: ordered.  Risk Prescription drug management.   Suzanne Moss 62 y.o. presented today for right shoulder pain. Working DDx that I considered at this time includes, but not limited to, muscle spasm, contusion, strain/sprain, fracture, dislocation, neurovascular compromise, septic joint, ischemic limb, compartment syndrome.  R/o DDx: contusion, strain/sprain, fracture, dislocation, neurovascular compromise, septic joint, ischemic limb, compartment syndrome: These are considered less likely due to history of present illness, physical exam, labs/imaging findings.  Review of prior external notes: 03/28/2022 unknown  Unique Tests and My Interpretation:  Right shoulder x-ray: No acute changes  Discussion with Independent Historian: None  Discussion of Management of Tests: None  Risk: Medium: prescription drug management  Risk Stratification Score: None  Plan: On exam patient was no acute distress with stable vitals.  Patient's physical dam is remarkable for tenderness in the right trapezius/supraspinatus region however the rest of her exam was reassuring.  Patient describes the pain as a tightening sensation that comes and goes which is suspicious of muscle spasm.  X-ray interpreted by me does not show any acute changes and will treat with heat packs along with lidocaine patches and encouraged patient to continue taking Tylenol every 6 hours and follow-up with her primary care provider.  While waiting for x-ray to be read patient stated that she wanted to leave and so we will monitor patient's x-ray and call if there is any abnormalities.  Patient will be prescribed lidocaine  patches along with Flexeril encouraged use heat packs and rest over the next 2 days and follow-up with primary care provider.  I spoke to the patient about how Flexeril is a sedating medication to not operate machinery or drive while taking his medication patient verbalized her understanding of this.  Patient was given return precautions. Patient stable for discharge at this time.  Patient verbalized understanding of plan.  This chart was dictated using voice recognition software.  Despite best efforts to proofread,  errors can occur which can change the documentation meaning.         Final Clinical Impression(s) / ED Diagnoses Final diagnoses:  Muscle spasm    Rx / DC Orders ED Discharge Orders          Ordered    cyclobenzaprine (FLEXERIL) 10 MG tablet  2 times daily PRN  11/13/22 1113    lidocaine (LIDODERM) 5 %  Every 24 hours        11/13/22 1113              Netta Corrigan, PA-C 11/13/22 1157    Virgina Norfolk, DO 11/13/22 1235

## 2023-03-07 ENCOUNTER — Emergency Department (HOSPITAL_BASED_OUTPATIENT_CLINIC_OR_DEPARTMENT_OTHER)
Admission: EM | Admit: 2023-03-07 | Discharge: 2023-03-07 | Disposition: A | Discharge status: Home / Self Care | Payer: Medicaid Other | Attending: Emergency Medicine | Admitting: Emergency Medicine

## 2023-03-07 ENCOUNTER — Other Ambulatory Visit: Payer: Self-pay

## 2023-03-07 ENCOUNTER — Encounter (HOSPITAL_BASED_OUTPATIENT_CLINIC_OR_DEPARTMENT_OTHER): Payer: Self-pay | Admitting: Emergency Medicine

## 2023-03-07 DIAGNOSIS — H109 Unspecified conjunctivitis: Secondary | ICD-10-CM | POA: Insufficient documentation

## 2023-03-07 DIAGNOSIS — H5789 Other specified disorders of eye and adnexa: Secondary | ICD-10-CM | POA: Diagnosis present

## 2023-03-07 MED ORDER — TOBRAMYCIN 0.3 % OP SOLN
2.0000 [drp] | Freq: Once | OPHTHALMIC | Status: AC
  Administered 2023-03-07: 2 [drp] via OPHTHALMIC
  Filled 2023-03-07: qty 5

## 2023-03-07 NOTE — ED Triage Notes (Signed)
Pt having bilateral eye redness with itching and swelling since Sunday.  Noted some redness near tear ducts.

## 2023-03-07 NOTE — Discharge Instructions (Signed)
It was our pleasure to provide your ER care today - we hope that you feel better.  Use tobrex eyes drops - 1-2 drops in each eye 4x/day for next 5 days. Avoid rubbing eyes or scratching around eyes. May gently apply cool moist compresses to area a few times per day for symptom relief.  Avoid any perfumed facial soaps, moisturizers or other face products.   Follow up with eye specialist in the next few days if symptoms fail to improve/resolve - call office to arrange appointment.   Return to ER if worse, new symptoms, high fevers, new/severe eye pain, increased swelling or redness around eyes, change in vision, or other concern.

## 2023-03-07 NOTE — ED Provider Notes (Signed)
[facility] Provider Note   CSN: [id number] Arrival date & time: 03/07/23  1117     History  Chief Complaint  Patient presents with   Eye Problem    Suzanne Moss is a 63 y.o. female.  Pt c/o bilateral eye redness and scant drainage in the past three days. Denies hx similar symptoms in prior past. No severe eye pain. No change in vision. No trauma to eyes. No chemical exposure. No new home or facial products. No eye drops. Denies cough, sore throat, rhinorrhea or uri symptoms. No headache. No photophobia. No contact use. No fever or chills. Indicates otherwise does not feel sick or ill.   The history is provided by the patient and medical records.  Eye Problem Associated symptoms: discharge, itching and redness   Associated symptoms: no headaches, no nausea and no vomiting        Home Medications Prior to Admission medications   Medication Sig Start Date End Date Taking? Authorizing Provider  amLODipine (NORVASC) 5 MG tablet Take 5 mg by mouth daily.    [provider]  atorvastatin (LIPITOR) 10 MG tablet Take 10 mg by mouth daily.    [provider]  benzocaine-resorcinol (VAGISIL) 5-2 % vaginal cream Place 1 application vaginally 2 (two) times daily as needed for itching or irritation.    [provider]  cephALEXin (KEFLEX) 500 MG capsule Take 1 capsule (500 mg total) by mouth 4 (four) times daily. 01/18/22   Cecil Cobbs, PA-C  cyclobenzaprine (FLEXERIL) 10 MG tablet Take 1 tablet (10 mg total) by mouth 2 (two) times daily as needed for muscle spasms. 11/13/22   Netta Corrigan, PA-C  diphenhydrAMINE (BENADRYL) 25 MG tablet Take 1 tablet (25 mg total) by mouth every 6 (six) hours. 04/04/12   Margarita Grizzle, MD  famotidine (PEPCID) 20 MG tablet Take 1 tablet (20 mg total) by mouth daily. 09/08/22   Peter Garter, PA  ibuprofen (ADVIL,MOTRIN) 200 MG tablet Take 400 mg by mouth every 6 (six)  hours as needed for pain.    [provider]  lidocaine (LIDODERM) 5 % Place 1 patch onto the skin daily. Remove & Discard patch within 12 hours or as directed by MD 05/07/21   Marita Kansas, PA-C  lidocaine (LIDODERM) 5 % Place 1 patch onto the skin daily. Remove & Discard patch within 12 hours or as directed by MD 11/13/22   Netta Corrigan, PA-C  methocarbamol (ROBAXIN) 500 MG tablet Take 1 tablet (500 mg total) by mouth 2 (two) times daily. 05/07/21   Marita Kansas, PA-C  naproxen (NAPROSYN) 375 MG tablet Take 1 tablet (375 mg total) by mouth 2 (two) times daily. 05/07/21   Karie Mainland, Amjad, PA-C  predniSONE (DELTASONE) 10 MG tablet Take 2 tablets (20 mg total) by mouth daily. 04/04/12   Margarita Grizzle, MD      Allergies    Gabapentin    Review of Systems   Review of Systems  Constitutional:  Negative for chills and fever.  HENT:  Negative for congestion, rhinorrhea and sore throat.   Eyes:  Positive for discharge, redness and itching. Negative for visual disturbance.  Respiratory:  Negative for cough.   Gastrointestinal:  Negative for nausea and vomiting.  Genitourinary:  Negative for dysuria.  Musculoskeletal:  Negative for neck pain.  Skin:  Negative for rash.  Neurological:  Negative for headaches.    Physical Exam Updated Vital Signs BP 120/74 (BP  Location: Right Arm)   Pulse 82   Temp 98.8 F (37.1 C) (Oral)   Resp 16   Ht 1.575 m (5\' 2" )   Wt 68 kg   SpO2 100%   BMI 27.44 kg/m  Physical Exam Vitals and nursing note reviewed.  Constitutional:      Appearance: Normal appearance. She is well-developed.  HENT:     Head: Atraumatic.     Nose: Nose normal. No congestion or rhinorrhea.     Mouth/Throat:     Mouth: Mucous membranes are moist.  Eyes:     General: No scleral icterus.    Extraocular Movements: Extraocular movements intact.     Pupils: Pupils are equal, round, and reactive to light.     Comments: Conjunctiva w mild erythema, conjunctivitis.  No proptosis. No pain  w eom. Lids everted, no fb. No abscess. No orbital or periorbital cellulitis noted. Sharp discs, no PE. Pupils briskly reactive, not painful, no corneal clouding.   Neck:     Trachea: No tracheal deviation.  Cardiovascular:     Rate and Rhythm: Normal rate.  Pulmonary:     Effort: Pulmonary effort is normal. No respiratory distress.  Musculoskeletal:        General: No swelling.     Cervical back: Normal range of motion and neck supple. No rigidity. No muscular tenderness.  Lymphadenopathy:     Cervical: No cervical adenopathy.  Skin:    General: Skin is warm and dry.     Findings: No rash.  Neurological:     Mental Status: She is alert.     Comments: Alert, speech normal. Visual acuity grossly intact (reading/seeing photos/poster on wall).  Psychiatric:        Mood and Affect: Mood normal.     ED Results / Procedures / Treatments   Labs (all labs ordered are listed, but only abnormal results are displayed) Labs Reviewed - No data to display  EKG None  Radiology No results found.  Procedures Procedures    Medications Ordered in ED Medications  tobramycin (TOBREX) 0.3 % ophthalmic solution 2 drop (has no administration in time range)    ED Course/ Medical Decision Making/ A&P                                 Medical Decision Making Problems Addressed: Conjunctivitis of both eyes, unspecified conjunctivitis type: acute illness or injury  Risk Prescription drug management.  Reviewed nursing notes and prior charts for additional history.   Exam c/w conjuncitivitis.   Tobrex eye drops.   Rec ophthy f/u in next few days if symptoms fail to improve/resolve.  Return precautions provided.           Final Clinical Impression(s) / ED Diagnoses Final diagnoses:  None    Rx / DC Orders ED Discharge Orders     None         Cathren Laine, MD 03/07/23 1322

## 2023-11-16 ENCOUNTER — Emergency Department (HOSPITAL_BASED_OUTPATIENT_CLINIC_OR_DEPARTMENT_OTHER)

## 2023-11-16 ENCOUNTER — Encounter (HOSPITAL_BASED_OUTPATIENT_CLINIC_OR_DEPARTMENT_OTHER): Payer: Self-pay | Admitting: Emergency Medicine

## 2023-11-16 ENCOUNTER — Other Ambulatory Visit: Payer: Self-pay

## 2023-11-16 ENCOUNTER — Emergency Department (HOSPITAL_BASED_OUTPATIENT_CLINIC_OR_DEPARTMENT_OTHER): Admission: EM | Admit: 2023-11-16 | Discharge: 2023-11-16 | Disposition: A | Discharge status: Home / Self Care

## 2023-11-16 DIAGNOSIS — I10 Essential (primary) hypertension: Secondary | ICD-10-CM | POA: Diagnosis not present

## 2023-11-16 DIAGNOSIS — R0789 Other chest pain: Secondary | ICD-10-CM | POA: Insufficient documentation

## 2023-11-16 DIAGNOSIS — Z79899 Other long term (current) drug therapy: Secondary | ICD-10-CM | POA: Insufficient documentation

## 2023-11-16 LAB — BASIC METABOLIC PANEL WITH GFR
Anion gap: 14 (ref 5–15)
BUN: 13 mg/dL (ref 8–23)
CO2: 20 mmol/L — ABNORMAL LOW (ref 22–32)
Calcium: 9.4 mg/dL (ref 8.9–10.3)
Chloride: 103 mmol/L (ref 98–111)
Creatinine, Ser: 0.67 mg/dL (ref 0.44–1.00)
GFR, Estimated: >60 mL/min (ref >60)
Glucose, Bld: 91 mg/dL (ref 70–99)
Potassium: 4.1 mmol/L (ref 3.5–5.1)
Sodium: 137 mmol/L (ref 135–145)

## 2023-11-16 LAB — CBC
HCT: 41.6 % (ref 36.0–46.0)
Hemoglobin: 13.7 g/dL (ref 12.0–15.0)
MCH: 24.7 pg — ABNORMAL LOW (ref 26.0–34.0)
MCHC: 32.9 g/dL (ref 30.0–36.0)
MCV: 75.1 fL — ABNORMAL LOW (ref 80.0–100.0)
Platelets: 293 K/uL (ref 150–400)
RBC: 5.54 MIL/uL — ABNORMAL HIGH (ref 3.87–5.11)
RDW: 15.2 % (ref 11.5–15.5)
WBC: 5.6 K/uL (ref 4.0–10.5)
nRBC: 0 % (ref 0.0–0.2)

## 2023-11-16 LAB — TROPONIN T, HIGH SENSITIVITY: Troponin T High Sensitivity: <15 ng/L (ref 0–19)

## 2023-11-16 MED ORDER — NAPROXEN 500 MG PO TABS: 500.0000 mg | ORAL_TABLET | Freq: Two times a day (BID) | ORAL | 0 refills | Status: AC

## 2023-11-16 NOTE — ED Provider Notes (Signed)
 [place] [facility] Provider Note   CSN: [id number] Arrival date & time: 11/16/23  0900     Patient presents with: Shoulder Pain   Suzanne Moss is a 63 y.o. female.   63 year old female with past medical history of hypertension and hyperlipidemia presenting to the emergency department today with some left-sided shoulder and chest pressure.  The patient states this been going now for the past 2 days.  This is not exertional.  She states is somewhat worse with movement but occasionally it will occur randomly.  The patient states that she did have an episode of diaphoresis yesterday but none today.  Denies any lightheadedness or shortness of breath.  She came to the ER today for further evaluation regarding this.  She denies a history of DVT or pulmonary embolism, recent surgeries, recent travel.  Denies any leg pain or swelling.   Shoulder Pain      Prior to Admission medications   Medication Sig Start Date End Date Taking? Authorizing Provider  naproxen  (NAPROSYN ) 500 MG tablet Take 1 tablet (500 mg total) by mouth 2 (two) times daily. 11/16/23  Yes Ula Prentice SAUNDERS, MD  amLODipine (NORVASC) 5 MG tablet Take 5 mg by mouth daily.    [provider]  atorvastatin (LIPITOR) 10 MG tablet Take 10 mg by mouth daily.    [provider]  benzocaine-resorcinol (VAGISIL) 5-2 % vaginal cream Place 1 application vaginally 2 (two) times daily as needed for itching or irritation.    [provider]  cephALEXin  (KEFLEX ) 500 MG capsule Take 1 capsule (500 mg total) by mouth 4 (four) times daily. 01/18/22   Renae Bernarda HERO, PA-C  cyclobenzaprine  (FLEXERIL ) 10 MG tablet Take 1 tablet (10 mg total) by mouth 2 (two) times daily as needed for muscle spasms. 11/13/22   Victor Lynwood DASEN, PA-C  diphenhydrAMINE  (BENADRYL ) 25 MG tablet Take 1 tablet (25 mg total) by mouth every 6 (six) hours. 04/04/12   Levander Houston, MD  famotidine  (PEPCID )  20 MG tablet Take 1 tablet (20 mg total) by mouth daily. 09/08/22   Silver Wonda LABOR, PA  ibuprofen (ADVIL,MOTRIN) 200 MG tablet Take 400 mg by mouth every 6 (six) hours as needed for pain.    [provider]  lidocaine  (LIDODERM ) 5 % Place 1 patch onto the skin daily. Remove & Discard patch within 12 hours or as directed by MD 05/07/21   Hildegard Loge, PA-C  lidocaine  (LIDODERM ) 5 % Place 1 patch onto the skin daily. Remove & Discard patch within 12 hours or as directed by MD 11/13/22   Victor Lynwood DASEN, PA-C  methocarbamol  (ROBAXIN ) 500 MG tablet Take 1 tablet (500 mg total) by mouth 2 (two) times daily. 05/07/21   Hildegard Loge, PA-C  predniSONE  (DELTASONE ) 10 MG tablet Take 2 tablets (20 mg total) by mouth daily. 04/04/12   Levander Houston, MD    Allergies: Gabapentin    Review of Systems  Respiratory:  Positive for chest tightness.   Musculoskeletal:  Positive for arthralgias.  All other systems reviewed and are negative.   Updated Vital Signs BP 115/76   Pulse 65   Temp 97.9 F (36.6 C)   Resp 15   Wt 68.9 kg   SpO2 100%   BMI 27.80 kg/m   Physical Exam Vitals and nursing note reviewed.   Gen: NAD Eyes: PERRL, EOMI HEENT: no oropharyngeal swelling Neck: trachea midline Resp: clear to auscultation bilaterally Card: RRR, no murmurs,  rubs, or gallops Abd: nontender, nondistended Extremities: no calf tenderness, no edema MSK: The patient has tender over the left thoracic paraspinal region with no midline tenderness, step-offs, or deformities. Vascular: 2+ radial pulses bilaterally, 2+ DP pulses bilaterally Neuro: No focal deficits Skin: no rashes Psyc: acting appropriately   (all labs ordered are listed, but only abnormal results are displayed) Labs Reviewed  BASIC METABOLIC PANEL WITH GFR - Abnormal; Notable for the following components:      Result Value   CO2 20 (*)    All other components within normal limits  CBC - Abnormal; Notable for the following components:    RBC 5.54 (*)    MCV 75.1 (*)    MCH 24.7 (*)    All other components within normal limits  TROPONIN T, HIGH SENSITIVITY  TROPONIN T, HIGH SENSITIVITY    EKG: EKG Interpretation Date/Time:  Friday November 16 2023 09:15:49 EDT Ventricular Rate:  68 PR Interval:  159 QRS Duration:  68 QT Interval:  535 QTC Calculation: 557 R Axis:   23  Text Interpretation: Sinus rhythm Ventricular premature complex Low voltage, precordial leads Abnormal R-wave progression, early transition Borderline repolarization abnormality Prolonged QT interval Confirmed by Ula Barter [phone]) on 11/16/2023 9:17:10 AM  Radiology: ARCOLA Shoulder Left Result Date: 11/16/2023 EXAM: 1 VIEW XRAY OF THE LEFT SHOULDER 11/16/2023 10:33:23 AM COMPARISON: None available. CLINICAL HISTORY: Non traumatic left shoulder pain x 3 days. Left upper chest tightness. No shortness of breath or cough. FINDINGS: BONES AND JOINTS: Glenohumeral joint is normally aligned. No acute fracture or dislocation. The [facility] joint is unremarkable in appearance. SOFT TISSUES: No abnormal calcifications. Visualized lung is unremarkable. CERVICAL SPINE: Cervical ACDF noted. VASCULATURE: Aortic atherosclerosis (ICD10-I70.0). IMPRESSION: 1. No acute abnormalities. Electronically signed by: Katheleen Faes MD 11/16/2023 10:38 AM EDT RP Workstation: [id number]   DG Chest Port 1 View Result Date: 11/16/2023 EXAM: 1 VIEW(S) XRAY OF THE CHEST 11/16/2023 10:10:00 AM COMPARISON: 09/08/2022 CLINICAL HISTORY: Chest pain, left shoulder pain x 3 days, left upper chest tightness. No SOB or cough. Hx HTN. FINDINGS: LUNGS AND PLEURA: Mild coarse interstitial opacities in both lung bases. No confluent airspace disease or overt edema. No pleural effusion. No pneumothorax. HEART AND MEDIASTINUM: Aortic atherosclerosis (ICD-10 I70.0). BONES AND SOFT TISSUES: Cervical fixation hardware stable. No acute osseous abnormality. IMPRESSION: 1. Mild coarse interstitial opacities in both lung  bases. 2. No confluent airspace disease or overt edema. Electronically signed by: Dayne Hassell MD 11/16/2023 10:37 AM EDT RP Workstation: [id number]     Procedures   Medications Ordered in the ED - No data to display                                  Medical Decision Making 63 year old female with past medical history of hypertension hyperlipidemia presenting to the emergency department today with some left-sided chest and shoulder discomfort.  Her exam is not consistent with septic arthritis.  The patient is neuro vastly intact so suspicion for aortic dissection is low.  I will further evaluate her here with basic labs Wels and EKG, chest x-ray, and troponin further for further evaluation for ACS, pulmonary edema, pulmonary infiltrates, pneumothorax.  Given her age we will also obtain x-ray of her left shoulder.  This is somewhat reproducible but given her risk factors if her workup is unremarkable I will refer her to cardiology for provocative testing.  The patient's troponin here is negative.  X-ray did show some questionable opacities at the lung bases.  Patient is satting 100% on room air.  In speaking with the patient she does not have any infectious symptoms.  She denies any significant swelling in her legs or other symptoms consistent with volume overload.  Will hold off on any antibiotics here.  I will have patient follow-up with cardiology as an outpatient and treat her for potential musculoskeletal pain.  She is discharged with return precautions.  Amount and/or Complexity of Data Reviewed Labs: ordered. Radiology: ordered.  Risk Prescription drug management.        Final diagnoses:  Left chest pressure    ED Discharge Orders          Ordered    Ambulatory referral to Cardiology       Comments: If you have not heard from the Cardiology office within the next 72 hours please call [phone].   11/16/23 1050    naproxen  (NAPROSYN ) 500 MG tablet  2 times daily         11/16/23 1051               Ula Prentice SAUNDERS, MD 11/16/23 1053

## 2023-11-16 NOTE — Discharge Instructions (Addendum)
 Your workup today was reassuring.  Please take the naproxen  twice daily which should help with pain or inflammation.  I have placed a consult to our cardiology team for them to schedule a follow-up appointment.  Please follow-up with them and return to the ER for worsening symptoms.

## 2023-11-16 NOTE — ED Triage Notes (Signed)
 Non traumatic left shoulder pain x 3 days , around shoulder .SABRA Left upper chest tightness . No shortness of breath or cough .

## 2023-11-26 ENCOUNTER — Encounter: Payer: Self-pay | Admitting: Physician Assistant

## 2023-11-26 ENCOUNTER — Ambulatory Visit: Attending: Physician Assistant | Admitting: Physician Assistant

## 2023-11-26 VITALS — BP 110/64 | HR 65 | Ht 62.0 in | Wt 156.4 lb

## 2023-11-26 DIAGNOSIS — R072 Precordial pain: Secondary | ICD-10-CM | POA: Diagnosis not present

## 2023-11-26 NOTE — Patient Instructions (Signed)
 Medication Instructions:  Your physician recommends that you continue on your current medications as directed. Please refer to the Current Medication list given to you today.  *If you need a refill on your cardiac medications before your next appointment, please call your pharmacy*  Lab Work: NONE ordered at this time of appointment   Testing/Procedures: Your physician has requested that you have an echocardiogram. Echocardiography is a painless test that uses sound waves to create images of your heart. It provides your doctor with information about the size and shape of your heart and how well your heart's chambers and valves are working. This procedure takes approximately one hour. There are no restrictions for this procedure. Please do NOT wear cologne, perfume, aftershave, or lotions (deodorant is allowed). Please arrive 15 minutes prior to your appointment time.  Please note: We ask at that you not bring children with you during ultrasound (echo/ vascular) testing. Due to room size and safety concerns, children are not allowed in the ultrasound rooms during exams. Our front office staff cannot provide observation of children in our lobby area while testing is being conducted. An adult accompanying a patient to their appointment will only be allowed in the ultrasound room at the discretion of the ultrasound technician under special circumstances. We apologize for any inconvenience.   Follow-Up: At [facility], you and your health needs are our priority.  As part of our continuing mission to provide you with exceptional heart care, our providers are all part of one team.  This team includes your primary Cardiologist (physician) and Advanced Practice Providers or APPs (Physician Assistants and Nurse Practitioners) who all work together to provide you with the care you need, when you need it.  Your next appointment:   6 week(s)  Provider:   Scot Ford, PA-C          We recommend  signing up for the patient portal called MyChart.  Sign up information is provided on this After Visit Summary.  MyChart is used to connect with patients for Virtual Visits (Telemedicine).  Patients are able to view lab/test results, encounter notes, upcoming appointments, etc.  Non-urgent messages can be sent to your provider as well.   To learn more about what you can do with MyChart, go to [email or web address].

## 2023-11-26 NOTE — Progress Notes (Addendum)
 Cardiology Office Note   Date:  11/26/2023  ID:  Suzanne Moss, Suzanne Moss 1960/05/26, MRN [id number] PCP: [facility], [facility]  [facility] Providers Cardiologist:  [facility] - DOD Dr. Ladona   History of Present Illness Suzanne Moss is a 63 y.o. female with past medical history of hypertension and hyperlipidemia.  She was previously seen by Suzanne Moss of [facility] cardiology service for preoperative clearance prior to cervical disc surgery and abnormal EKG in February 2017.  Echocardiogram showed normal EF, no significant valvular disease.  She was recently seen in the emergency room on 11/16/2023 for left sided shoulder and chest pressure.  Symptom was going on for 2 days and did not correlate with physical exertion.  Troponin was negative.  CBC showed hemoglobin of 13.7.  Renal function and electrolyte normal.  Chest x-ray showed mild coarsened interstitial opacity in both lung bases, however no acute finding.  Shoulder x-ray showed no acute finding either.  She was prescribed naproxen  for potential musculoskeletal pain and referred to cardiology service.  [facility] of chest discomfort.  She says the chest pressure or tightening sensation lasted about 1.5 weeks before self resolving.  She initially said her chest discomfort was one continuous episode, however later said it was intermittent.  Interestingly, she walk at least 20 minutes on a daily basis and the chest discomfort actually feels better when she exert herself.  She has tried to rolled her shoulders which also made the chest discomfort feel better.  The symptoms she is describing is quite atypical.  I recommend echocardiogram to make sure her ejection fraction is normal and there is no wall motion abnormality.  I plan to reassess the patient in 6 weeks to make sure the chest discomfort does not come back.  She is aware to contact cardiology service if she does  have recurrent chest pain, at which point I will order a coronary CTA.  She does not have significant family history of CAD. Her blood pressure is controlled on minimal amount of medication.  ROS:   She denies palpitations, dyspnea, pnd, orthopnea, n, v, dizziness, syncope, edema, weight gain, or early satiety. All other systems reviewed and are otherwise negative except as noted above.   She had chest pain for 1.5 weeks which has resolved the last week.  Studies Reviewed          Risk Assessment/Calculations           Physical Exam VS:  BP 110/64 (BP Location: Right Arm, Patient Position: Sitting)   Pulse 65   Ht 5' 2 (1.575 m)   Wt 156 lb 6.4 oz (70.9 kg)   SpO2 99%   BMI 28.61 kg/m        Wt Readings from Last 3 Encounters:  11/26/23 156 lb 6.4 oz (70.9 kg)  11/16/23 152 lb (68.9 kg)  03/07/23 150 lb (68 kg)    GEN: Well nourished, well developed in no acute distress NECK: No JVD; No carotid bruits CARDIAC: RRR, no murmurs, rubs, gallops RESPIRATORY:  Clear to auscultation without rales, wheezing or rhonchi  ABDOMEN: Soft, non-tender, non-distended EXTREMITIES:  No edema; No deformity   ASSESSMENT AND PLAN  Chest discomfort: Symptom resolved last week and that has not came back.  It lasted a total of 1.5 weeks.  Troponin was negative in the ED.  EKG unchanged.  Symptom actually feels better with exertion.  She was treated with naproxen   by ED physician.  I recommended echocardiogram as initial evaluation to make sure her EF and wall motion are normal.  I plan to reassess the patient in 6 weeks, if symptom recurs, we will consider coronary CTA.  She has been advised to contact us  if the symptom does not occur prior to the next visit, if so I will have low threshold of ordering a stress test or coronary CTA before the next office visit.       Dispo: Follow-up in 6 weeks  Signed, Ellan Tess, PA

## 2023-12-29 LAB — COLOGUARD: COLOGUARD: POSITIVE — AB

## 2024-02-11 ENCOUNTER — Telehealth (HOSPITAL_COMMUNITY): Payer: Self-pay | Admitting: Physician Assistant

## 2024-02-11 NOTE — Telephone Encounter (Signed)
 Patient cancelled echocardiogram for reason below:  02/11/2024 8:50 AM Ab:IPKNW, ALICIA D  Cancel Rsn: Patient (No longer needed)  Order will be removed from the echo WQ. Thank you.

## 2024-02-13 ENCOUNTER — Ambulatory Visit (HOSPITAL_COMMUNITY)
